# Patient Record
Sex: Female | Born: 1945 | Race: White | Hispanic: No | Smoking: Never smoker
Health system: Southern US, Community
[De-identification: ages and names within clinical notes are randomized; demographics above are authoritative.]

## PROBLEM LIST (undated history)

## (undated) DIAGNOSIS — E538 Deficiency of other specified B group vitamins: Secondary | ICD-10-CM

## (undated) DIAGNOSIS — G919 Hydrocephalus, unspecified: Secondary | ICD-10-CM

## (undated) DIAGNOSIS — C4490 Unspecified malignant neoplasm of skin, unspecified: Secondary | ICD-10-CM

## (undated) DIAGNOSIS — H9313 Tinnitus, bilateral: Secondary | ICD-10-CM

## (undated) DIAGNOSIS — K219 Gastro-esophageal reflux disease without esophagitis: Secondary | ICD-10-CM

## (undated) DIAGNOSIS — L738 Other specified follicular disorders: Secondary | ICD-10-CM

## (undated) DIAGNOSIS — N393 Stress incontinence (female) (male): Secondary | ICD-10-CM

## (undated) DIAGNOSIS — M1990 Unspecified osteoarthritis, unspecified site: Secondary | ICD-10-CM

## (undated) DIAGNOSIS — M810 Age-related osteoporosis without current pathological fracture: Secondary | ICD-10-CM

## (undated) DIAGNOSIS — M6530 Trigger finger, unspecified finger: Secondary | ICD-10-CM

## (undated) DIAGNOSIS — E785 Hyperlipidemia, unspecified: Secondary | ICD-10-CM

## (undated) DIAGNOSIS — L570 Actinic keratosis: Secondary | ICD-10-CM

## (undated) DIAGNOSIS — N3281 Overactive bladder: Secondary | ICD-10-CM

## (undated) DIAGNOSIS — N200 Calculus of kidney: Secondary | ICD-10-CM

## (undated) DIAGNOSIS — D126 Benign neoplasm of colon, unspecified: Secondary | ICD-10-CM

## (undated) DIAGNOSIS — Z86010 Personal history of colonic polyps: Secondary | ICD-10-CM

## (undated) DIAGNOSIS — N319 Neuromuscular dysfunction of bladder, unspecified: Secondary | ICD-10-CM

## (undated) DIAGNOSIS — E559 Vitamin D deficiency, unspecified: Secondary | ICD-10-CM

## (undated) DIAGNOSIS — N952 Postmenopausal atrophic vaginitis: Secondary | ICD-10-CM

## (undated) DIAGNOSIS — Z860101 Personal history of adenomatous and serrated colon polyps: Secondary | ICD-10-CM

## (undated) DIAGNOSIS — G5600 Carpal tunnel syndrome, unspecified upper limb: Secondary | ICD-10-CM

## (undated) DIAGNOSIS — K644 Residual hemorrhoidal skin tags: Secondary | ICD-10-CM

## (undated) HISTORY — DX: Tinnitus, bilateral: H93.13

## (undated) HISTORY — DX: Hydrocephalus, unspecified: G91.9

## (undated) HISTORY — DX: Hyperlipidemia, unspecified: E78.5

## (undated) HISTORY — DX: Overactive bladder: N32.81

## (undated) HISTORY — DX: Other specified follicular disorders: L73.8

## (undated) HISTORY — DX: Calculus of kidney: N20.0

## (undated) HISTORY — PX: TONSILLECTOMY: SUR1361

## (undated) HISTORY — DX: Actinic keratosis: L57.0

## (undated) HISTORY — DX: Benign neoplasm of colon, unspecified: D12.6

## (undated) HISTORY — DX: Unspecified osteoarthritis, unspecified site: M19.90

## (undated) HISTORY — DX: Trigger finger, unspecified finger: M65.30

## (undated) HISTORY — DX: Stress incontinence (female) (male): N39.3

## (undated) HISTORY — DX: Vitamin D deficiency, unspecified: E55.9

## (undated) HISTORY — PX: EYE SURGERY: SHX253

## (undated) HISTORY — DX: Personal history of colonic polyps: Z86.010

## (undated) HISTORY — DX: Personal history of adenomatous and serrated colon polyps: Z86.0101

## (undated) HISTORY — DX: Neuromuscular dysfunction of bladder, unspecified: N31.9

## (undated) HISTORY — DX: Unspecified malignant neoplasm of skin, unspecified: C44.90

## (undated) HISTORY — DX: Carpal tunnel syndrome, unspecified upper limb: G56.00

## (undated) HISTORY — DX: Postmenopausal atrophic vaginitis: N95.2

## (undated) HISTORY — DX: Age-related osteoporosis without current pathological fracture: M81.0

## (undated) HISTORY — DX: Deficiency of other specified B group vitamins: E53.8

## (undated) HISTORY — DX: Residual hemorrhoidal skin tags: K64.4

## (undated) HISTORY — PX: ABDOMINAL HYSTERECTOMY: SHX81

---

## 1998-10-10 DIAGNOSIS — D126 Benign neoplasm of colon, unspecified: Secondary | ICD-10-CM

## 1998-10-10 HISTORY — DX: Benign neoplasm of colon, unspecified: D12.6

## 2004-11-30 ENCOUNTER — Ambulatory Visit: Payer: Self-pay | Admitting: Internal Medicine

## 2004-12-03 ENCOUNTER — Ambulatory Visit (HOSPITAL_COMMUNITY): Admission: RE | Admit: 2004-12-03 | Discharge: 2004-12-03 | Payer: Self-pay | Admitting: Internal Medicine

## 2004-12-09 HISTORY — PX: ESOPHAGOGASTRODUODENOSCOPY: SHX1529

## 2004-12-09 HISTORY — PX: COLONOSCOPY: SHX174

## 2004-12-10 ENCOUNTER — Ambulatory Visit: Payer: Self-pay | Admitting: Internal Medicine

## 2004-12-10 ENCOUNTER — Ambulatory Visit (HOSPITAL_COMMUNITY): Admission: RE | Admit: 2004-12-10 | Discharge: 2004-12-10 | Payer: Self-pay | Admitting: Internal Medicine

## 2005-03-11 ENCOUNTER — Ambulatory Visit: Payer: Self-pay | Admitting: Internal Medicine

## 2006-05-04 IMAGING — RF DG ESOPHAGUS
14 of 24 series · 14 of 24 positions shown · non-contrast
Comparison: none

CLINICAL DATA: Dysphagia.  Patient states it feels like food is getting stuck in the throat/upper esophagus.  Thin liquids are not a problem.  Reportedly the esophagus was stretched 5-7 years ago.
BARIUM ESOPHAGRAM:
Single and double contrast studies were performed with the patient in the erect and recumbent positions.  The study was performed under fluoroscopic control.
The swallowing function appeared normal.  Symmetrical valleculae and pyriform sinuses.  Primary peristaltic stripping wave intact.  No tertiary contractions.  
Slight irregularity and slight mucosal fold thickening of the distal esophagus.I feel that the findings are compatible with reflux esophagitis.Mild stricture just proximal to a small hiatal hernia.  The patient swallowed a 13 mm in diameter barium tablet in the erect position and the tablet did not readily enter the stomach until after a few minutes of swallowing additional barium and water.

[Series 1: run · 1 of 1 slices shown (1 of 14)]
[im 1/1]
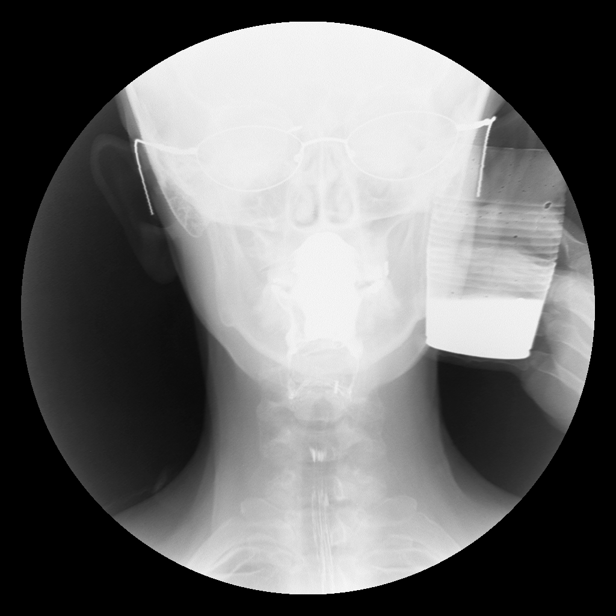

[Series 3: run · 1 of 1 slices shown (2 of 14)]
[im 1/1]
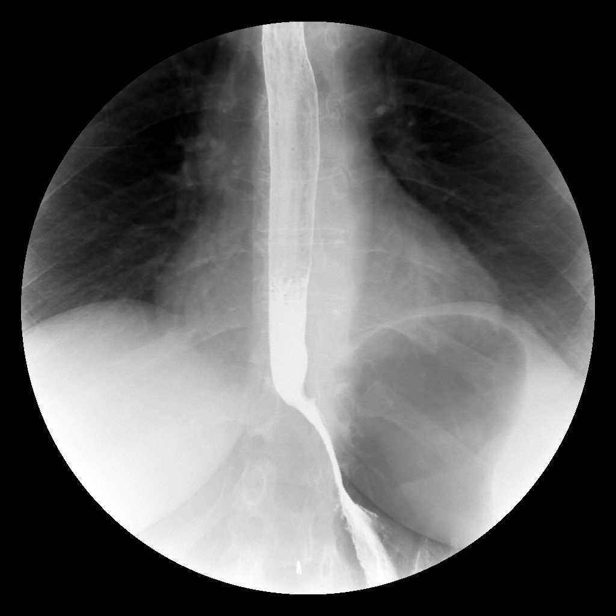

[Series 5: run · 1 of 1 slices shown (3 of 14)]
[im 1/1]
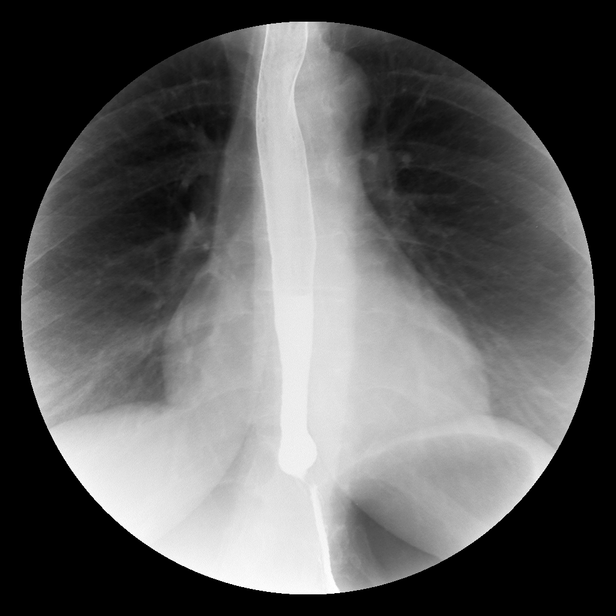

[Series 7: run · 1 of 1 slices shown (4 of 14)]
[im 1/1]
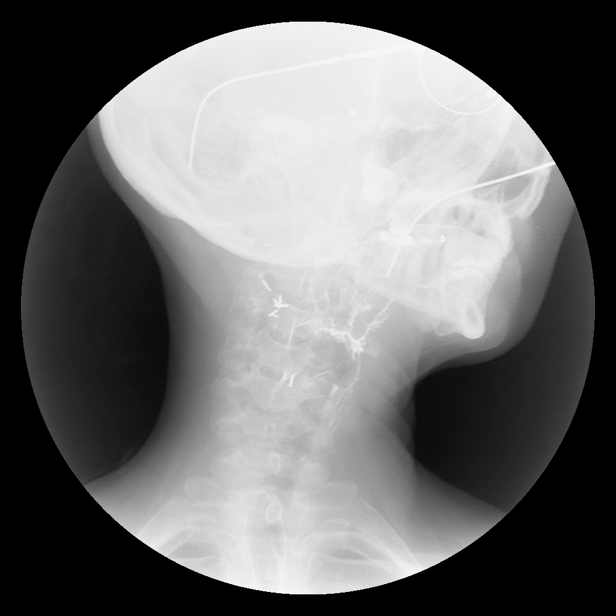

[Series 8: run · 1 of 1 slices shown (5 of 14)]
[im 1/1]
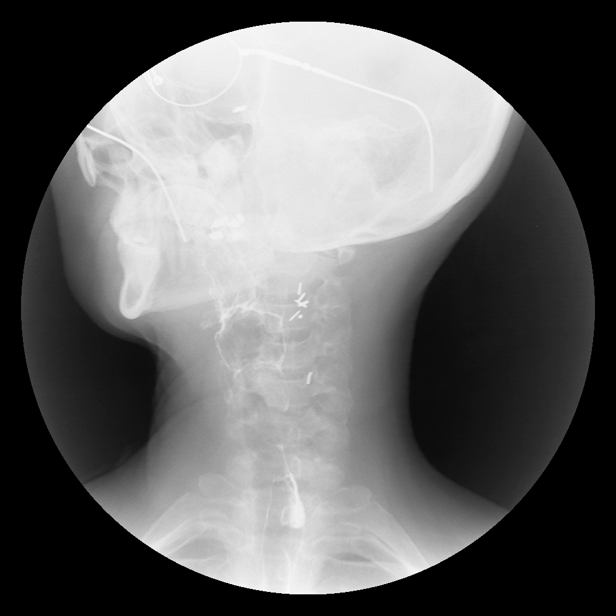

[Series 10: run · 1 of 1 slices shown (6 of 14)]
[im 1/1]
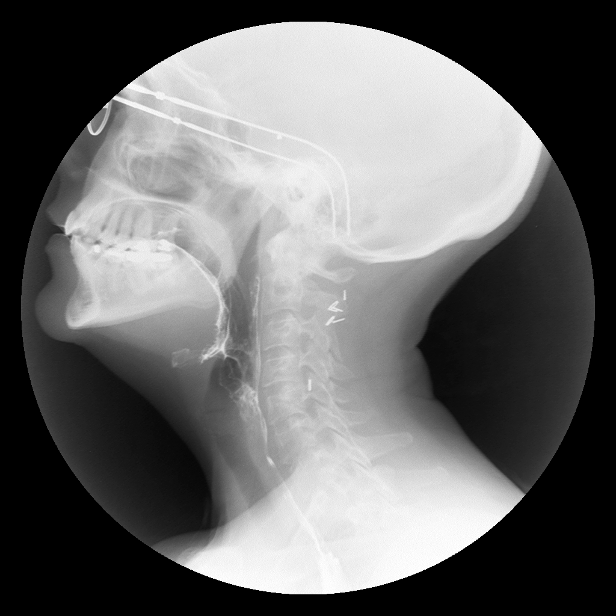

[Series 12: run · 1 of 1 slices shown (7 of 14)]
[im 1/1]
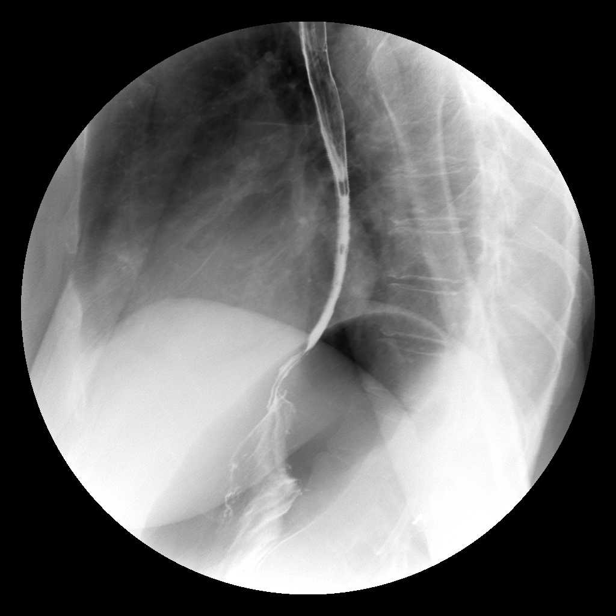

[Series 13: run · 1 of 1 slices shown (8 of 14)]
[im 1/1]
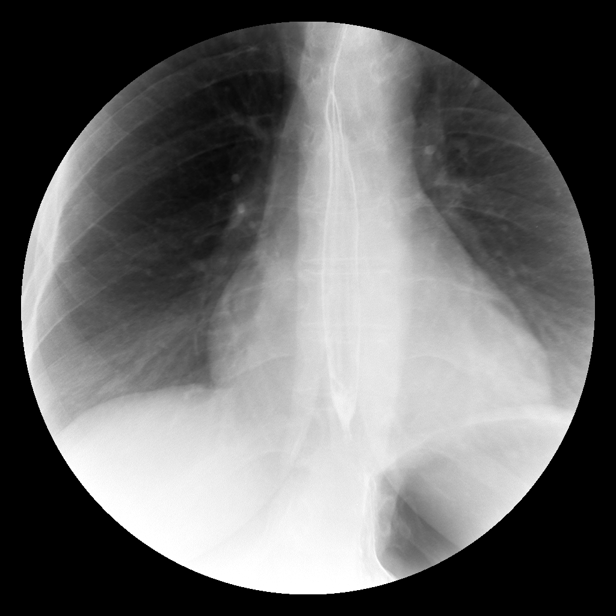

[Series 15: run · 1 of 1 slices shown (9 of 14)]
[im 1/1]
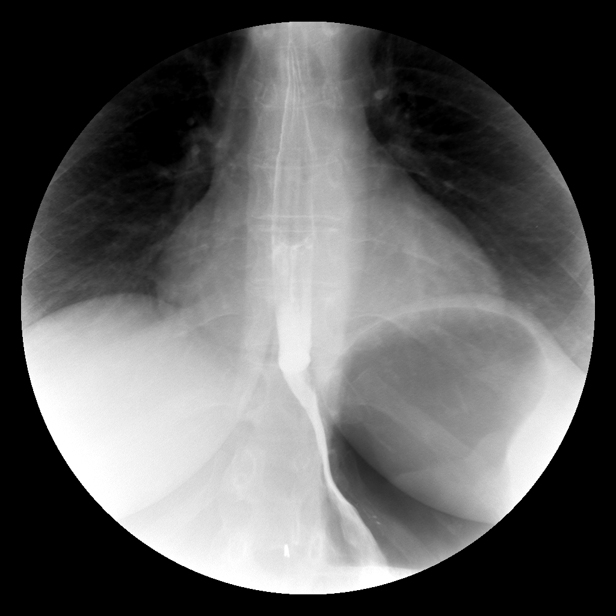

[Series 17: run · 1 of 1 slices shown (10 of 14)]
[im 1/1]
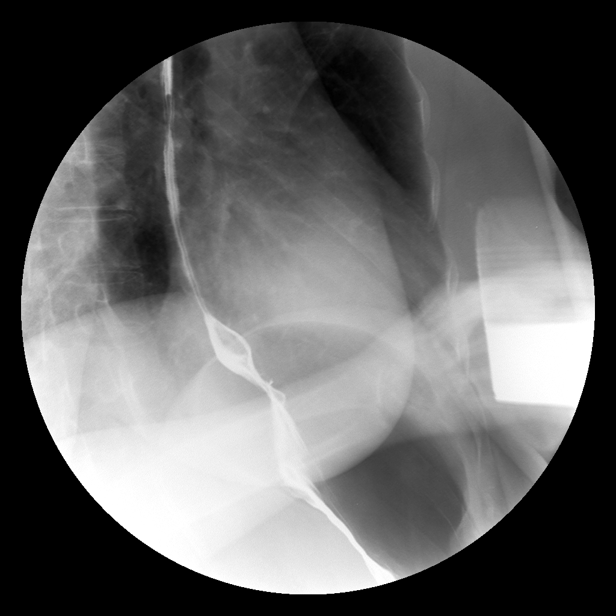

[Series 19: run · 1 of 1 slices shown (11 of 14)]
[im 1/1]
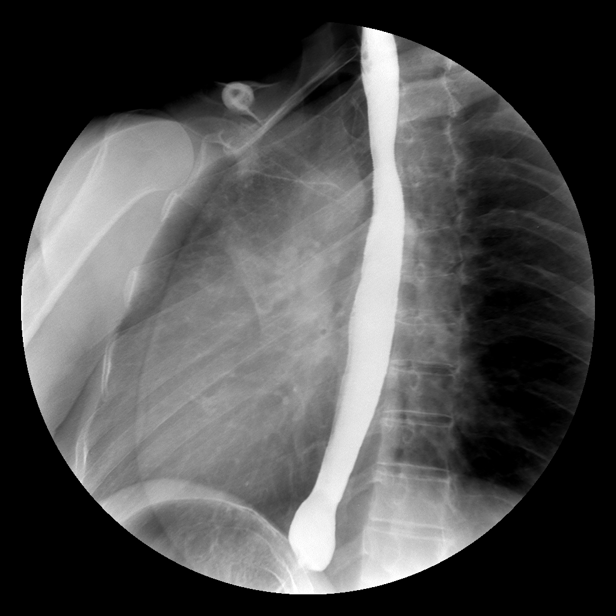

[Series 20: run · 1 of 1 slices shown (12 of 14)]
[im 1/1]
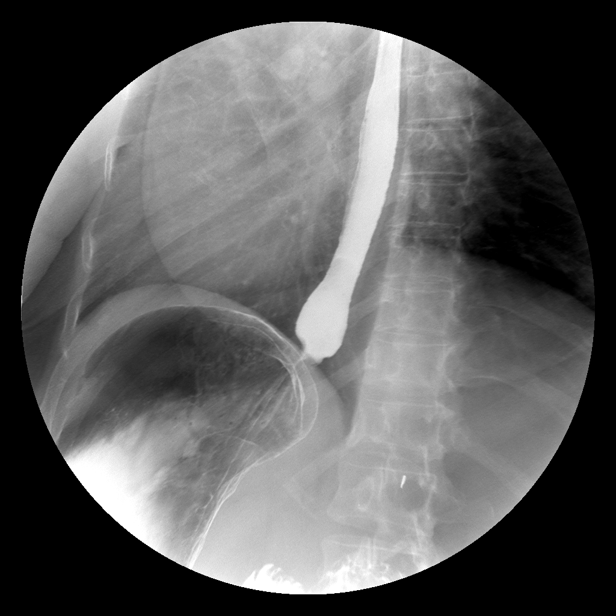

[Series 22: run · 1 of 1 slices shown (13 of 14)]
[im 1/1]
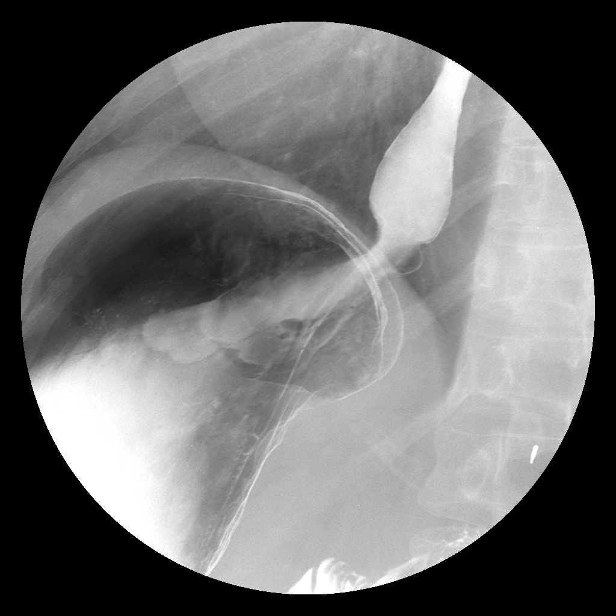

[Series 24: run · 1 of 1 slices shown (14 of 14)]
[im 1/1]
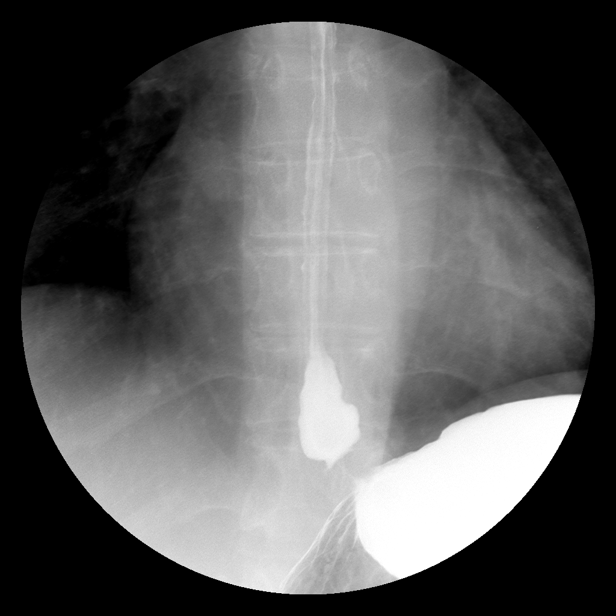

[14 of 24 positions shown; findings below may reference images not displayed]

IMPRESSION: Findings compatible with changes due to chronic reflux esophagitis, i.e., mild stricture at the gastroesophageal junction.
Small hiatal hernia.  Gastroesophageal reflux.

## 2009-10-10 HISTORY — PX: COLONOSCOPY: SHX174

## 2010-01-18 ENCOUNTER — Encounter (INDEPENDENT_AMBULATORY_CARE_PROVIDER_SITE_OTHER): Payer: Self-pay | Admitting: *Deleted

## 2010-07-27 ENCOUNTER — Telehealth (INDEPENDENT_AMBULATORY_CARE_PROVIDER_SITE_OTHER): Payer: Self-pay

## 2010-08-06 ENCOUNTER — Encounter: Payer: Self-pay | Admitting: Internal Medicine

## 2010-08-20 ENCOUNTER — Ambulatory Visit: Payer: Self-pay | Admitting: Internal Medicine

## 2010-08-20 ENCOUNTER — Ambulatory Visit (HOSPITAL_COMMUNITY): Admission: RE | Admit: 2010-08-20 | Discharge: 2010-08-20 | Payer: Self-pay | Admitting: Internal Medicine

## 2010-08-25 ENCOUNTER — Encounter: Payer: Self-pay | Admitting: Internal Medicine

## 2010-11-09 NOTE — Progress Notes (Signed)
Summary: Changed time only for colonoscopy  Phone Note Call from Patient   Caller: Patient Summary of Call: Pt was scheduled for colonoscopy on 08/20/2010 @ 7:30 and she called and rescheduled to same day at 10:30 AM.  (Order to be scanned after Dr. Jena Gauss signs off ). Changed in IDX and faxed change to [facility]. Initial call taken by: Cloria Spring LPN,  July 27, 2010 4:23 PM

## 2010-11-09 NOTE — Letter (Signed)
Summary: Scheduled Appointment  [facility]  [address]   [place], [place] [address]   Phone: [phone]  Fax: [phone]    January 18, 2010   Dear: Amy Morales            DOB: 27-Sep-1946    I have been instructed to schedule you an appointment in our office.  Your appointment is as follows:   Date: 02/12/10   Time: 1100am     Please be here 15 minutes early.   Provider: Clement Husbands    Please contact the office if you need to reschedule this appointment for a more convenient time.   Thank you,    [facility] Ph: [phone]   Fax: [phone]

## 2010-11-09 NOTE — Letter (Signed)
Summary: [facility] ORDER/TRIAGE  [facility] ORDER/TRIAGE   Imported By: Rexene Alberts 08/06/2010 11:27:53  _____________________________________________________________________  External Attachment:    Type:   Image     Comment:   External Document

## 2010-11-09 NOTE — Letter (Signed)
Summary: Patient Notice, Colon Biopsy Results  [facility]  [address]   [place], [place] [address]   Phone: [phone]  Fax: [phone]       August 25, 2010   Amy Morales [address] [place], [place]  [address] 04/09/1946    Dear [name],  I am pleased to inform you that the biopsies taken during your recent colonoscopy did not show any evidence of cancer upon pathologic examination.  Additional information/recommendations:  No further action is needed at this time.  Please follow-up with your primary care physician for your other healthcare needs.  You should have a repeat colonoscopy examination  in 5 years.  Please call us if you are having persistent problems or have questions about your condition that have not been fully answered at this time.  Sincerely,    R. Roetta Sessions MD, FACP [facility] Ph: [phone]    Fax: [phone]   Appended Document: Patient Notice, Colon Biopsy Results Letter mailed to pt. Copies for PCP, also.  Appended Document: Patient Notice, Colon Biopsy Results reminder in computer

## 2011-02-25 NOTE — H&P (Signed)
Amy Morales, Amy Morales              ACCOUNT NO.:  [id number]   MEDICAL RECORD NO.:  [id number]           PATIENT TYPE:   LOCATION:                                 FACILITY:   PHYSICIAN:  R. Roetta Sessions, M.D. DATE OF BIRTH:  02-07-1946   DATE OF ADMISSION:  11/30/2004  DATE OF DISCHARGE:                                HISTORY & PHYSICAL   CHIEF COMPLAINT:  Rectal bleeding.   HISTORY OF PRESENT ILLNESS:  The patient is a 65 year old lady who presents  with a two-week-history of hematochezia.  She is passing bright red blood  per rectum with bowel movements.  She has had some constipation.  Her bowels  are intermittent in nature and she may go for several days without a bowel  movement, but then may have a bowel movement every day for several days.  Sometime her stools are hard.  Denies any melena, abdominal pain, weight  loss, nausea, vomiting, or heartburn.  Sometimes it feels like her food does  not go down well.  She has had symptoms which are similar over the last five  years and are not worsening.  She has had her esophagus empirically  stretched five years ago at the time of a normal EGD.  She also complains of  rhinorrhea and post-nasal drip along with a cough and congestion for about  three weeks.  Denies any fevers or chills.   CURRENT MEDICATIONS:  1.  Premarin 0.625 mg daily.  2.  Multivitamin over-the-counter daily.  3.  Vitamin C daily.  4.  Vitamin E 400 IU daily.  5.  Vitamin B12, 500 mcg daily.  6.  Calcium 600 mg plus D daily.  7.  Chromium 200 mg daily.  8.  Vitamin A 10,000 IU daily.  9.  Aspirin 81 mg daily.  10. Tums p.r.n.  11. Zocor daily.  12. Fish oil daily.  13. B-complex daily.   ALLERGIES:  CODEINE.   PAST MEDICAL/SURGICAL HISTORY:  1.  Hypercholesterolemia.  2.  History of possible hydrocephalus over 35 years ago.  3.  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  4.  She has chronic constipation.  5.  Hemorrhoids.  6.  History of tubular villus and tubular adenoma at the time of colonoscopy      in 2000.  Her last colonoscopy in April 2003, revealed prominent      engorged internal hemorrhoids.  A single anal papilla.  7.  She has had a hysterectomy.   FAMILY HISTORY:  Father deceased.  Had black lung disease.  He had a  myocardial infarction as well.  She has a brother with alcoholic cirrhosis.  She has a son with a history of colonic polyps.  No family history of  colorectal cancer.   SOCIAL HISTORY:  She is divorced.  She has three sons.  She is employed and  works at [employer].  She does not smoke or drink alcohol.   REVIEW OF SYSTEMS:  See the HPI for GI and  HEENT.  CARDIOPULMONARY:  Denies  chest pain or shortness of breath.  See the HPI regarding cough.   PHYSICAL EXAMINATION:  VITAL SIGNS:  Weight is 147-1/2 pounds, temperature  97.9 degrees, blood pressure 110/70, pulse 68.  GENERAL:  Pleasant well-developed and well-nourished Caucasian female, in no  acute distress.  SKIN:  Warm and dry.  No jaundice.  HEENT:  Conjunctivae are pink. Sclerae anicteric.  Oropharyngeal mucosa is  moist and pink.  No erythema.  She does have some mucus in the back of her  throat.  Maxillary sinuses look mildly tender to percussion.  NECK:  No lymphadenopathy.  CHEST:  Clear to auscultation.  HEART:  A regular rate and rhythm.  Normal S1, S2.  No murmurs, rubs, or  gallops.  ABDOMEN:  Positive bowel sounds, soft, nontender, nondistended.  No  organomegaly or masses.  No rebound tenderness or guarding.  EXTREMITIES:  No edema.   IMPRESSION:  Amy Morales is a 65 year old lady with a history of colonic  adenomatous polyps, who presents with a two-week history of hematochezia.  This may very well be due to hemorrhoids; however, given her last  colonoscopy was over three years ago, I would recommend one at this time for  further evaluation and diagnostic purposes.  In addition, she  continues to  have chronic mild dysphagia.  Previously empiric esophageal dilatation did  not seem to help.  Currently she appears to have acute/chronic sinusitis.   PLAN:  1.  Barium esophagram.  We will arrange for this to be done prior to her      colonoscopy, in case we need to proceed with an EGD as well.  2.  Colonoscopy in the near future.  3.  Will provide her with SBE prophylaxis, as we have done in the past,      given the history of a shunt.  4.  Levaquin 500 mg p.o. daily for 10 days, #10 with 0 refills.      LL/MEDQ  D:  11/30/2004  T:  11/30/2004  Job:  [id number]

## 2011-02-25 NOTE — Op Note (Signed)
NAME:  Amy Morales, Amy Morales              ACCOUNT NO.:  [id number]   MEDICAL RECORD NO.:  [id number]          PATIENT TYPE:  AMB   LOCATION:  DAY                           FACILITY:  [facility]   PHYSICIAN:  R. Roetta Sessions, M.D. DATE OF BIRTH:  12-10-1945   DATE OF PROCEDURE:  12/09/2004  DATE OF DISCHARGE:                                 OPERATIVE REPORT   PROCEDURES:  Esophagogastroduodenoscopy with Elease Hashimoto dilation, followed by  colonoscopy, diagnostic.   INDICATION FOR PROCEDURE:  The patient is a 65 year old lady with a history  of colonic polyps who presents with a two-week history of intermittent  hematochezia.  Also, she has esophageal dysphagia to solids.  Barium pill  esophagogram demonstrated delay in passage of barium pills and granularity  of the esophageal mucosa suspicious for reflux esophagitis.  EGD and  colonoscopy are now being done.  This approach has been discussed with the  patient at length, the potential risks, benefits, and alternatives have been  reviewed, questions answered, and she is agreeable.  Please see  documentation in the medical record.   PROCEDURE:  O2 saturation, blood pressure, pulse, and respiration were  monitored throughout the entirety of the procedures.   CONSCIOUS SEDATION:  Versed 10 mg IV, Demerol 150 mg IV in divided doses.   INSTRUMENT USED:  Olympus video chip system.   FINDINGS:  Esophagogastroduodenoscopy:  Examination of the tubular esophagus  revealed what appeared to be a noncritical ring with associated distal  esophageal erosions with no Barrett's esophagus, fixed stricture or any  evidence of neoplasia.  The EG junction was easily traversed with the scope.   The gastric cavity was empty and insufflated well with air.  A thorough  examination of the gastric mucosa, including a retroflexed view of the  proximal stomach and esophagogastric junction, demonstrated only a very  small hiatal hernia.  Pylorus patent and easily traversed.   Examination of  the bulb and second portion revealed no abnormalities.   Therapeutic/diagnostic maneuvers performed:  I initially attempted to pass a  33 French dilator, but I could not get it started beyond the hypopharynx.  Subsequently I obtained a 11 [place] and passed it to full insertion  with ease.  A look back revealed the ring had been ruptured without apparent  complication.  The patient tolerated the procedure well and was prepared for  colonoscopy.   Digital rectal examination revealed no abnormalities.  Endoscopic findings:  The prep was good.   Rectum:  Examination of the rectal mucosa including a retroflexed view of  the anal verge revealed en face view of the anal canal that demonstrated  only some friable internal hemorrhoids.   Colon:  The colonic mucosa was surveyed from the rectosigmoid junction  through the left, transverse and right colon to the area of the appendiceal  orifice, ileocecal valve and cecum.  These structures were well-seen and  photographed for the record.  From this level the scope was slowly  withdrawn, all previous-mentioned mucosal surfaces were again seen.  The  patient had a solitary 3 mm AVM in the mid-descending colon  felt to be an  innocent bystander.  The colonic mucosa otherwise appeared normal.  The  patient tolerated the procedure well and was reacted in endoscopy.   IMPRESSION:  Colonoscopy:  1.  Friable anal canal hemorrhoids, otherwise normal rectum.  2.  Quiescent 3 mm arteriovenous malformation, mid-descending colon, not      felt to be causing any symptoms, not manipulated.  The remainder of the      colon appeared normal.   Esophagogastroduodenoscopy findings:  1.  Noncritical-appearing Schatzki's ring with associated distal esophageal      erosions consistent with mild reflux esophagitis, status post Maloney      dilation as described above, otherwise normal esophagus.  2.  Small hiatal hernia, otherwise normal  stomach, D1, D2.   RECOMMENDATIONS:  1.  Aciphex 20 mg orally daily before breakfast for three months.  2.  Hemorrhoid literature provided to Ms. Fahs.  3.  A 10-day course of Anusol-HC suppositories one per rectum at bedtime.  4.  A follow-up appointment with [place] in three months.  5.  She should plan to have another colonoscopy in five years.      RMR/MEDQ  D:  12/10/2004  T:  12/10/2004  Job:  [id number]

## 2015-07-23 ENCOUNTER — Encounter: Payer: Self-pay | Admitting: Internal Medicine

## 2015-09-09 ENCOUNTER — Ambulatory Visit (INDEPENDENT_AMBULATORY_CARE_PROVIDER_SITE_OTHER): Payer: Medicare Other | Admitting: Gastroenterology

## 2015-09-09 ENCOUNTER — Other Ambulatory Visit: Payer: Self-pay

## 2015-09-09 ENCOUNTER — Encounter: Payer: Self-pay | Admitting: Gastroenterology

## 2015-09-09 VITALS — BP 118/78 | HR 91 | Temp 97.6°F | Ht 63.0 in | Wt 144.8 lb

## 2015-09-09 DIAGNOSIS — K5900 Constipation, unspecified: Secondary | ICD-10-CM | POA: Insufficient documentation

## 2015-09-09 DIAGNOSIS — Z86010 Personal history of colonic polyps: Secondary | ICD-10-CM

## 2015-09-09 MED ORDER — NA SULFATE-K SULFATE-MG SULF 17.5-3.13-1.6 GM/177ML PO SOLN: 1.0000 | Freq: Once | ORAL | Status: AC

## 2015-09-09 NOTE — Patient Instructions (Signed)
1. Colonoscopy as planned. See separate instructions.  2. Start Linzess once daily on empty stomach FIVE days before your procedure. Samples provided.

## 2015-09-09 NOTE — Progress Notes (Signed)
Primary Care Physician:  [name], MD    Primary Gastroenterologist:  Garfield Cornea, MD   Chief Complaint  Patient presents with  . Colonoscopy    HPI:  Amy Morales is a 69 y.o. female here to schedule surveillance colonoscopy. 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 Last colonoscopy 2011 with adenomatous colon polyp. She has chronic constipation. Recently added probiotics which may be helping a little. 3 bowel movements per week. No blood in the stool or melena. No abdominal pain. Denies heartburn, dysphagia, weight loss. Currently being treated for pneumonia, bronchitis, sinusitis.    Current Outpatient Prescriptions  Medication Sig Dispense Refill  . B Complex-C-Folic Acid (KP B COMPLEX-C) TABS Take by mouth daily.     . cefdinir (OMNICEF) 300 MG capsule Take by mouth.    Marland Kitchen imipramine (TOFRANIL) 50 MG tablet Take 50 mg by mouth at bedtime.     . predniSONE (DELTASONE) 20 MG tablet Take 20 mg by mouth daily with breakfast.     . simvastatin (ZOCOR) 40 MG tablet Take 40 mg by mouth daily at 6 PM.      No current facility-administered medications for this visit.    Allergies as of 09/09/2015 - Review Complete 09/09/2015  Allergen Reaction Noted  . Latex Rash 09/09/2015  . Codeine Nausea And Vomiting 09/09/2015    Past Medical History  Diagnosis Date  . Hyperlipemia     Past Surgical History  Procedure Laterality Date  . Esophagogastroduodenoscopy  12/09/04    RMR:non-critical appearing schatzkis ring/small HH otherwise normal  . Colonoscopy  12/09/04    [id number] anal canal hemorrhoids otherwise normal rectum. quiescent 64mm AVM, mid-descending colon, not manipulated.   . Colonoscopy  2011    RMR: adenomatous colon polyp    Family History  Problem Relation Age of Onset  . Colon cancer Neg Hx   . Colon polyps Son   . Lung disease Father     black lung  . Heart attack Father     Social History   Social History  . Marital  Status: Divorced    Spouse Name: [name]  . Number of Children: [name]  . Years of Education: [name]   Occupational History  . Not on file.   Social History Main Topics  . Smoking status: Never Smoker   . Smokeless tobacco: Not on file  . Alcohol Use: No  . Drug Use: No  . Sexual Activity: Not on file   Other Topics Concern  . Not on file   Social History Narrative      ROS:  General: Negative for anorexia, weight loss, fever, chills, fatigue, weakness. Eyes: Negative for vision changes.  ENT: Negative for hoarseness, difficulty swallowing , nasal congestion. CV: Negative for chest pain, angina, palpitations, dyspnea on exertion, peripheral edema.  Respiratory: Negative for dyspnea at rest, dyspnea on exertion, cough, sputum, wheezing.  GI: See history of present illness. GU:  Negative for dysuria, hematuria, urinary incontinence, urinary frequency, nocturnal urination.  MS: Negative for joint pain, low back pain.  Derm: Negative for rash or itching.  Neuro: Negative for weakness, abnormal sensation, seizure, frequent headaches, memory loss, confusion.  Psych: Negative for anxiety, depression, suicidal ideation, hallucinations.  Endo: Negative for unusual weight change.  Heme: Negative for bruising or bleeding. Allergy: Negative for rash or hives.    Physical Examination:  BP 118/78 mmHg  Pulse 91  Temp(Src) 97.6 F (36.4 C) (Oral)  Ht 5\' 3"  (1.6 m)  Wt 144 lb 12.8 oz (65.681 kg)  BMI 25.66 kg/m2   General: Well-nourished, well-developed in no acute distress.  Head: Normocephalic, atraumatic.   Eyes: Conjunctiva pink, no icterus. Mouth: Oropharyngeal mucosa moist and pink , no lesions erythema or exudate. Neck: Supple without thyromegaly, masses, or lymphadenopathy.  Lungs: Clear to auscultation bilaterally.  Heart: Regular rate and rhythm, no murmurs rubs or gallops.  Abdomen: Bowel sounds are normal, nontender, nondistended, no hepatosplenomegaly or masses, no  abdominal bruits or    hernia , no rebound or guarding.   Rectal: deferred Extremities: No lower extremity edema. No clubbing or deformities.  Neuro: Alert and oriented x 4 , grossly normal neurologically.  Skin: Warm and dry, no rash or jaundice.   Psych: Alert and cooperative, normal mood and affect.   Imaging Studies: No results found.

## 2015-09-14 ENCOUNTER — Encounter: Payer: Self-pay | Admitting: Gastroenterology

## 2015-09-14 NOTE — Progress Notes (Signed)
cc'ed to pcp °

## 2015-09-14 NOTE — Assessment & Plan Note (Signed)
69 year old female with history of adenomatous colon polyps here to schedule surveillance colonoscopy. She has chronic constipation. Probiotics helping. Prior to colonoscopy, will start Linzess once daily for five days before procedure. Samples provided.  I have discussed the risks, alternatives, benefits with regards to but not limited to the risk of reaction to medication, bleeding, infection, perforation and the patient is agreeable to proceed. Written consent to be obtained.

## 2015-09-15 ENCOUNTER — Other Ambulatory Visit: Payer: Self-pay

## 2015-09-15 DIAGNOSIS — Z86010 Personal history of colonic polyps: Secondary | ICD-10-CM

## 2015-09-15 DIAGNOSIS — K5900 Constipation, unspecified: Secondary | ICD-10-CM

## 2015-09-21 ENCOUNTER — Telehealth: Payer: Self-pay | Admitting: Internal Medicine

## 2015-09-21 ENCOUNTER — Other Ambulatory Visit: Payer: Self-pay

## 2015-09-21 NOTE — Telephone Encounter (Signed)
Patient called to say that her pharmacy said that they have not received her prep prescription. I told her that it was confirmed on 11/30 that [facility] in [place], [place] received it. Patient said she just got off the phone with them and they said that they didn't have it. Please resend.

## 2015-09-21 NOTE — Telephone Encounter (Signed)
Called pharmacy and gave verbal order for RX for prep

## 2015-10-13 ENCOUNTER — Ambulatory Visit (HOSPITAL_COMMUNITY)
Admission: RE | Admit: 2015-10-13 | Discharge: 2015-10-13 | Disposition: A | Discharge status: Home / Self Care | Payer: Medicare Other | Source: Ambulatory Visit | Attending: Internal Medicine | Admitting: Internal Medicine

## 2015-10-13 ENCOUNTER — Encounter (HOSPITAL_COMMUNITY): Payer: Self-pay | Admitting: *Deleted

## 2015-10-13 ENCOUNTER — Encounter (HOSPITAL_COMMUNITY)
Admission: RE | Disposition: A | Discharge status: Home / Self Care | Payer: Self-pay | Source: Ambulatory Visit | Attending: Internal Medicine

## 2015-10-13 DIAGNOSIS — K5900 Constipation, unspecified: Secondary | ICD-10-CM

## 2015-10-13 DIAGNOSIS — D125 Benign neoplasm of sigmoid colon: Secondary | ICD-10-CM

## 2015-10-13 DIAGNOSIS — E785 Hyperlipidemia, unspecified: Secondary | ICD-10-CM | POA: Diagnosis not present

## 2015-10-13 DIAGNOSIS — K219 Gastro-esophageal reflux disease without esophagitis: Secondary | ICD-10-CM | POA: Insufficient documentation

## 2015-10-13 DIAGNOSIS — Z1211 Encounter for screening for malignant neoplasm of colon: Secondary | ICD-10-CM | POA: Insufficient documentation

## 2015-10-13 DIAGNOSIS — Z79899 Other long term (current) drug therapy: Secondary | ICD-10-CM | POA: Insufficient documentation

## 2015-10-13 DIAGNOSIS — Q2733 Arteriovenous malformation of digestive system vessel: Secondary | ICD-10-CM | POA: Diagnosis not present

## 2015-10-13 DIAGNOSIS — Z86010 Personal history of colonic polyps: Secondary | ICD-10-CM

## 2015-10-13 HISTORY — PX: COLONOSCOPY: SHX5424

## 2015-10-13 HISTORY — DX: Gastro-esophageal reflux disease without esophagitis: K21.9

## 2015-10-13 SURGERY — COLONOSCOPY
Anesthesia: Moderate Sedation

## 2015-10-13 MED ORDER — ONDANSETRON HCL 4 MG/2ML IJ SOLN
INTRAMUSCULAR | Status: AC
  Filled 2015-10-13: qty 2

## 2015-10-13 MED ORDER — MEPERIDINE HCL 100 MG/ML IJ SOLN
INTRAMUSCULAR | Status: DC | PRN
  Administered 2015-10-13 (×2): 25 mg via INTRAVENOUS
  Administered 2015-10-13: 50 mg via INTRAVENOUS

## 2015-10-13 MED ORDER — ONDANSETRON HCL 4 MG/2ML IJ SOLN
INTRAMUSCULAR | Status: DC | PRN
  Administered 2015-10-13: 4 mg via INTRAVENOUS

## 2015-10-13 MED ORDER — SODIUM CHLORIDE 0.9 % IV SOLN
INTRAVENOUS | Status: DC
  Administered 2015-10-13: 1000 mL via INTRAVENOUS

## 2015-10-13 MED ORDER — MIDAZOLAM HCL 5 MG/5ML IJ SOLN
INTRAMUSCULAR | Status: DC | PRN
  Administered 2015-10-13 (×2): 1 mg via INTRAVENOUS
  Administered 2015-10-13 (×2): 2 mg via INTRAVENOUS

## 2015-10-13 MED ORDER — MIDAZOLAM HCL 5 MG/5ML IJ SOLN
INTRAMUSCULAR | Status: DC
Start: 2015-10-13 — End: 2015-10-13
  Filled 2015-10-13: qty 10

## 2015-10-13 MED ORDER — MEPERIDINE HCL 100 MG/ML IJ SOLN
INTRAMUSCULAR | Status: AC
  Filled 2015-10-13: qty 2

## 2015-10-13 NOTE — H&P (Signed)
@  [id number]   Primary Care Physician:  [name], MD Primary Gastroenterologist:  Dr. Gala Romney  Pre-Procedure History & Physical: HPI:  Amy Morales is a 70 y.o. female here for   Past Medical History  Diagnosis Date  . Hyperlipemia   . Hydrocephalus     went stent in 1969  . Tubulovillous adenoma polyp of colon 2000  . GERD (gastroesophageal reflux disease)     Past Surgical History  Procedure Laterality Date  . Esophagogastroduodenoscopy  12/09/04    RMR:non-critical appearing schatzkis ring/small HH otherwise normal  . Colonoscopy  12/09/04    [id number] anal canal hemorrhoids otherwise normal rectum. quiescent 76mm AVM, mid-descending colon, not manipulated.   . Colonoscopy  2011    RMR: adenomatous colon polyp  . Abdominal hysterectomy      Prior to Admission medications   Medication Sig Start Date End Date Taking? Authorizing Provider  B Complex-C-Folic Acid (KP B COMPLEX-C) TABS Take 1 tablet by mouth daily.    Yes Historical Provider, MD  imipramine (TOFRANIL) 50 MG tablet Take 50 mg by mouth at bedtime.    Yes Historical Provider, MD  simvastatin (ZOCOR) 40 MG tablet Take 40 mg by mouth daily at 6 PM.    Yes Historical Provider, MD    Allergies as of 09/15/2015 - Review Complete 09/09/2015  Allergen Reaction Noted  . Latex Rash 09/09/2015  . Codeine Nausea And Vomiting 09/09/2015    Family History  Problem Relation Age of Onset  . Colon cancer Neg Hx   . Colon polyps Son   . Lung disease Father     black lung  . Heart attack Father     Social History   Social History  . Marital Status: Divorced    Spouse Name: [name]  . Number of Children: [name]  . Years of Education: [name]   Occupational History  . Not on file.   Social History Main Topics  . Smoking status: Never Smoker   . Smokeless tobacco: Not on file  . Alcohol Use: No  . Drug Use: No  . Sexual Activity: Not on file   Other Topics Concern  . Not on file   Social History Narrative     Review of Systems: See HPI, otherwise negative ROS  Physical Exam: BP 120/71 mmHg  Pulse 85  Temp(Src) 97.8 F (36.6 C) (Oral)  Resp 15  Ht 5\' 3"  (1.6 m)  Wt 142 lb (64.411 kg)  BMI 25.16 kg/m2  SpO2 95% General:    pleasant and cooperative in NAD Skin:  Intact without significant lesions or rashes. Eyes:  Sclera clear, no icterus.   Conjunctiva pink. Lungs:  Clear throughout to auscultation.   No wheezes, crackles, or rhonchi. No acute distress. Heart:  Regular rate and rhythm; no murmurs, clicks, rubs,  or gallops. Abdomen: Non-distended, normal bowel sounds.  Soft and nontender without appreciable mass or hepatosplenomegaly.  Pulses:  Normal pulses noted. Extremities:  Without clubbing or edema.  Impression:  Pleasant 70 year old lady with a history of advanced adenoma previously. Here for surveillance examination.  The risks, benefits, limitations, alternatives and imponderables have been reviewed with the patient. Questions have been answered. All parties are agreeable.   Recommendations:   Surveillance colonoscopy later today. Further recommendations to follow.     Notice: This dictation was prepared with Dragon dictation along with smaller phrase technology. Any transcriptional errors that result from this process are unintentional and may not be corrected upon review.

## 2015-10-13 NOTE — Discharge Instructions (Signed)
°Colonoscopy °Discharge Instructions ° °Read the instructions outlined below and refer to this sheet in the next few weeks. These discharge instructions provide you with general information on caring for yourself after you leave the hospital. Your doctor may also give you specific instructions. While your treatment has been planned according to the most current medical practices available, unavoidable complications occasionally occur. If you have any problems or questions after discharge, call Dr. Rourk at [phone]. °ACTIVITY °· You may resume your regular activity, but move at a slower pace for the next 24 hours.  °· Take frequent rest periods for the next 24 hours.  °· Walking will help get rid of the air and reduce the bloated feeling in your belly (abdomen).  °· No driving for 24 hours (because of the medicine (anesthesia) used during the test).   °· Do not sign any important legal documents or operate any machinery for 24 hours (because of the anesthesia used during the test).  °NUTRITION °· Drink plenty of fluids.  °· You may resume your normal diet as instructed by your doctor.  °· Begin with a light meal and progress to your normal diet. Heavy or fried foods are harder to digest and may make you feel sick to your stomach (nauseated).  °· Avoid alcoholic beverages for 24 hours or as instructed.  °MEDICATIONS °· You may resume your normal medications unless your doctor tells you otherwise.  °WHAT YOU CAN EXPECT TODAY °· Some feelings of bloating in the abdomen.  °· Passage of more gas than usual.  °· Spotting of blood in your stool or on the toilet paper.  °IF YOU HAD POLYPS REMOVED DURING THE COLONOSCOPY: °· No aspirin products for 7 days or as instructed.  °· No alcohol for 7 days or as instructed.  °· Eat a soft diet for the next 24 hours.  °FINDING OUT THE RESULTS OF YOUR TEST °Not all test results are available during your visit. If your test results are not back during the visit, make an appointment  with your caregiver to find out the results. Do not assume everything is normal if you have not heard from your caregiver or the medical facility. It is important for you to follow up on all of your test results.  °SEEK IMMEDIATE MEDICAL ATTENTION IF: °· You have more than a spotting of blood in your stool.  °· Your belly is swollen (abdominal distention).  °· You are nauseated or vomiting.  °· You have a temperature over 101.  °· You have abdominal pain or discomfort that is severe or gets worse throughout the day.  ° ° °Colon polyp information provided °Further recommendations to follow pending review of pathology report ° ° °Colon Polyps °Polyps are lumps of extra tissue growing inside the body. Polyps can grow in the large intestine (colon). Most colon polyps are noncancerous (benign). However, some colon polyps can become cancerous over time. Polyps that are larger than a pea may be harmful. To be safe, caregivers remove and test all polyps. °CAUSES  °Polyps form when mutations in the genes cause your cells to grow and divide even though no more tissue is needed. °RISK FACTORS °There are a number of risk factors that can increase your chances of getting colon polyps. They include: °· Being older than 50 years. °· Family history of colon polyps or colon cancer. °· Long-term colon diseases, such as colitis or Crohn disease. °· Being overweight. °· Smoking. °· Being inactive. °· Drinking too much alcohol. °  SYMPTOMS  °Most small polyps do not cause symptoms. If symptoms are present, they may include: °· Blood in the stool. The stool may look dark red or black. °· Constipation or diarrhea that lasts longer than 1 week. °DIAGNOSIS °People often do not know they have polyps until their caregiver finds them during a regular checkup. Your caregiver can use 4 tests to check for polyps: °· Digital rectal exam. The caregiver wears gloves and feels inside the rectum. This test would find polyps only in the rectum. °· Barium  enema. The caregiver puts a liquid called barium into your rectum before taking X-rays of your colon. Barium makes your colon look white. Polyps are dark, so they are easy to see in the X-ray pictures. °· Sigmoidoscopy. A thin, flexible tube (sigmoidoscope) is placed into your rectum. The sigmoidoscope has a light and tiny camera in it. The caregiver uses the sigmoidoscope to look at the last third of your colon. °· Colonoscopy. This test is like sigmoidoscopy, but the caregiver looks at the entire colon. This is the most common method for finding and removing polyps. °TREATMENT  °Any polyps will be removed during a sigmoidoscopy or colonoscopy. The polyps are then tested for cancer. °PREVENTION  °To help lower your risk of getting more colon polyps: °· Eat plenty of fruits and vegetables. Avoid eating fatty foods. °· Do not smoke. °· Avoid drinking alcohol. °· Exercise every day. °· Lose weight if recommended by your caregiver. °· Eat plenty of calcium and folate. Foods that are rich in calcium include milk, cheese, and broccoli. Foods that are rich in folate include chickpeas, kidney beans, and spinach. °HOME CARE INSTRUCTIONS °Keep all follow-up appointments as directed by your caregiver. You may need periodic exams to check for polyps. °SEEK MEDICAL CARE IF: °You notice bleeding during a bowel movement. °  °This information is not intended to replace advice given to you by your health care provider. Make sure you discuss any questions you have with your health care provider. °  °Document Released: 06/22/2004 Document Revised: 10/17/2014 Document Reviewed: 12/06/2011 °Elsevier Interactive Patient Education ©2016 Elsevier Inc. ° °

## 2015-10-13 NOTE — Op Note (Signed)
[facility] [address] [place], [address]   COLONOSCOPY PROCEDURE REPORT  PATIENT: Amy Morales, Amy Morales  MR#: [id number] BIRTHDATE: 1945-11-22 , 69  yrs. old GENDER: female ENDOSCOPIST: R.  Garfield Cornea, MD FACP [facility] REFERRED BY:Dr. Marjory Lies PROCEDURE DATE:  10/27/15 PROCEDURE:   Colonoscopy with snare polypectomy INDICATIONS:surveillance examination; history of colonic adenoma. MEDICATIONS: Versed 6 mg IV and Demerol 100 mg IV in divided doses. Zofran 4 mg IV. ASA CLASS:       Class II  CONSENT: The risks, benefits, alternatives and imponderables including but not limited to bleeding, perforation as well as the possibility of a missed lesion have been reviewed.  The potential for biopsy, lesion removal, etc. have also been discussed. Questions have been answered.  All parties agreeable.  Please see the history and physical in the medical record for more information.  DESCRIPTION OF PROCEDURE:   After the risks benefits and alternatives of the procedure were thoroughly explained, informed consent was obtained.  The digital rectal exam revealed no abnormalities of the rectum.   The [id number]  endoscope was introduced through the anus and advanced to the   . No adverse events experienced.   The quality of the prep was adequate  The instrument was then slowly withdrawn as the colon was fully examined. Estimated blood loss is zero unless otherwise noted in this procedure report.      COLON FINDINGS: Normal-appearing rectal mucosa; (1) 5 millimeter polyp in the mid sigmoid segment; (2) nonbleeding AVMs?"one in the descending segment and one in the cecum; otherwise, the remainder of the colonic mucosa appeared normal.  Retroflexion was performed. .  Withdrawal time=8 minutes 0 seconds.  The scope was withdrawn and the procedure completed. COMPLICATIONS: There were no immediate complications. EBL 2 mL ENDOSCOPIC IMPRESSION: Single colonic  polyp?"removed as described above.  Nonbleeding colonic AVMs  RECOMMENDATIONS: Follow up on pathology.  eSigned:  R. Garfield Cornea, MD Rosalita Chessman [facility] October 27, 2015 12:53 PM   cc:  CPT CODES: ICD CODES:  The ICD and CPT codes recommended by this software are interpretations from the data that the clinical staff has captured with the software.  The verification of the translation of this report to the ICD and CPT codes and modifiers is the sole responsibility of the health care institution and practicing physician where this report was generated.  [employer] will not be held responsible for the validity of the ICD and CPT codes included on this report.  AMA assumes no liability for data contained or not contained herein. CPT is a Designer, television/film set of the [employer].  PATIENT NAME:  Amy Morales, Amy Morales MR#: [id number]

## 2015-10-15 ENCOUNTER — Encounter: Payer: Self-pay | Admitting: Internal Medicine

## 2015-10-15 ENCOUNTER — Encounter (HOSPITAL_COMMUNITY): Payer: Self-pay | Admitting: Internal Medicine

## 2019-11-18 ENCOUNTER — Encounter: Payer: Self-pay | Admitting: Internal Medicine

## 2019-11-30 ENCOUNTER — Encounter: Payer: Self-pay | Admitting: Gastroenterology

## 2019-11-30 NOTE — Progress Notes (Signed)
Referring Provider: [name], MD Primary Care Physician:  [name], MD Primary Gastroenterologist:  Dr. Gala Romney  Chief Complaint  Patient presents with  . Dysphagia    HPI:   Amy Morales is a 74 y.o. female presenting today at the request of [name], MD for dysphagia.  GI history significant for constipation, adenomatous colon polyps, and dysphagia. Last EGD in 2006 with noncritical Schatzki ring with distal esophageal erosions without Barrett's s/p dilation, small hiatal hernia, otherwise normal.  Last colonoscopy in January 2017 with 5 mm tubular adenoma, nonbleeding AVMs, otherwise normal.  Recommendations to repeat colonoscopy in 5 years (2022). Last seen by our staff at the time of her colonoscopy.   Today: Has had dysphagia for years. Esophagus dilated in 2006. Symptoms improved for a while after dilation. Has been having return of symptoms for several years now. Reports swallow study in [place] last year that showed a hiatal hernia. Symptoms have been worsening. Occurring more frequently- a couple times a month. Foods are getting stuck around the sternal notch. Typically with meats. No trouble with soft foods or liquids. Doesn't have to bring food up. Will go down in about 5 minutes. States if she drinks something to try to push it down, symptoms worsen. Also has trouble with pills.   Occasional heartburn/GERD symptoms but not even once a month. No abdominal pain. No NSAIDs. No blood in the stool or melena.   Admits to mild constipation. States she has never had BMs regularly. Currently with a BM 2-3 times a week. Typically soft and formed. Rare hard stools or straining. Actually feeling constipation is going some better at this time.   Past Medical History:  Diagnosis Date  . GERD (gastroesophageal reflux disease)   . Hydrocephalus ([place])    went stent in 1969  . Hyperlipemia   . Tubulovillous adenoma polyp of colon 2000    Past Surgical History:    Procedure Laterality Date  . ABDOMINAL HYSTERECTOMY    . COLONOSCOPY  12/09/04   [id number] anal canal hemorrhoids otherwise normal rectum. quiescent 49mm AVM, mid-descending colon, not manipulated.   . COLONOSCOPY  2011   RMR: adenomatous colon polyp  . COLONOSCOPY [name] 10/13/2015   Procedure: COLONOSCOPY;  Surgeon: Daneil Dolin, MD; 5 mm tubular adenoma, nonbleeding AVMs, otherwise normal.  Repeat in 5 years (2022).  . ESOPHAGOGASTRODUODENOSCOPY  12/09/2004   RMR:non-critical appearing schatzkis ring s/p dilation/small HH otherwise normal  . 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 B Complex-C-Folic Acid (KP B COMPLEX-C) TABS Take 1 tablet by mouth daily.     . cholecalciferol (VITAMIN D3) 25 MCG (1000 UNIT) tablet Take 2,000 Units by mouth daily.    . fexofenadine (ALLEGRA) 180 MG tablet Take 180 mg by mouth daily.    Marland Kitchen imipramine (TOFRANIL) 50 MG tablet Take 25 mg by mouth at bedtime.     Marland Kitchen omeprazole (PRILOSEC) 20 MG capsule Take 20 mg by mouth daily.    . simvastatin (ZOCOR) 40 MG tablet Take 40 mg by mouth daily at 6 PM.      No current facility-administered medications for this visit.    Allergies as of 12/02/2019 - Review Complete 12/02/2019  Allergen Reaction Noted  . Latex Rash 09/09/2015  . Codeine Nausea And Vomiting 09/09/2015    Family History  Problem Relation Age of Onset  . Colon polyps Son   .  Lung disease Father        black lung  . Heart attack Father   . Colon cancer Neg Hx     Social History   Socioeconomic History  . Marital status: Divorced    Spouse name: Not on file  . Number of children: Not on file  . Years of education: Not on file  . Highest education level: Not on file  Occupational History  . Not on file  Tobacco Use  . Smoking status: Never Smoker  . Smokeless tobacco: Never Used  Substance and Sexual Activity  . Alcohol use: No    Alcohol/week: 0.0  standard drinks  . Drug use: No  . Sexual activity: Not on file  Other Topics Concern  . Not on file  Social History Narrative  . Not on file   Social Determinants of Health   Financial Resource Strain:   . Difficulty of Paying Living Expenses: Not on file  Food Insecurity:   . Worried About Charity fundraiser in the Last Year: Not on file  . Ran Out of Food in the Last Year: Not on file  Transportation Needs:   . Lack of Transportation (Medical): Not on file  . Lack of Transportation (Non-Medical): Not on file  Physical Activity:   . Days of Exercise per Week: Not on file  . Minutes of Exercise per Session: Not on file  Stress:   . Feeling of Stress : Not on file  Social Connections:   . Frequency of Communication with Friends and Family: Not on file  . Frequency of Social Gatherings with Friends and Family: Not on file  . Attends Religious Services: Not on file  . Active Member of Clubs or Organizations: Not on file  . Attends Archivist Meetings: Not on file  . Marital Status: Not on file  Intimate Partner Violence:   . Fear of Current or Ex-Partner: Not on file  . Emotionally Abused: Not on file  . Physically Abused: Not on file  . Sexually Abused: Not on file    Review of Systems: Gen: Denies any fever, chills, pre-syncope or syncope.  CV: Denies chest pain or heart palpitations Resp: Denies shortness of breath or cough. GI: See HPI Derm: Denies rash Psych: Denies depression or anxiety Heme: See HPI  Physical Exam: BP (!) 144/88   Pulse 85   Temp 98 F (36.7 C) (Temporal)   Ht 5\' 3"  (1.6 m)   Wt 145 lb (65.8 kg)   BMI 25.69 kg/m  General:   Alert and oriented. Pleasant and cooperative. Well-nourished and well-developed.  Head:  Normocephalic and atraumatic. Eyes:  Without icterus, sclera clear and conjunctiva pink.  Ears:  Normal auditory acuity. Lungs:  Clear to auscultation bilaterally. No wheezes, rales, or rhonchi. No distress.  Heart:   S1, S2 present without murmurs appreciated.  Abdomen:  +BS, soft, non-tender and non-distended. No HSM noted. No guarding or rebound. No masses appreciated.  Rectal:  Deferred  Msk:  Symmetrical without gross deformities. Normal posture.. Extremities:  Without edema. Neurologic:  Alert and  oriented x4;  grossly normal neurologically. Skin:  Intact without significant lesions or rashes. Psych: Normal mood and affect.

## 2019-12-02 ENCOUNTER — Encounter: Payer: Self-pay | Admitting: Gastroenterology

## 2019-12-02 ENCOUNTER — Ambulatory Visit (INDEPENDENT_AMBULATORY_CARE_PROVIDER_SITE_OTHER): Payer: Medicare Other | Admitting: Gastroenterology

## 2019-12-02 ENCOUNTER — Other Ambulatory Visit: Payer: Self-pay

## 2019-12-02 VITALS — BP 144/88 | HR 85 | Temp 98.0°F | Ht 63.0 in | Wt 145.0 lb

## 2019-12-02 DIAGNOSIS — R1310 Dysphagia, unspecified: Secondary | ICD-10-CM | POA: Insufficient documentation

## 2019-12-02 DIAGNOSIS — K5900 Constipation, unspecified: Secondary | ICD-10-CM

## 2019-12-02 NOTE — Patient Instructions (Signed)
We will get you scheduled for an upper endoscopy with possible dilation of your esophagus in the near future with Dr. Gala Romney.   Should something get stuck in your esophagus and not come up or go down, you should proceed to the emergency room.   For now, be sure all meats are ground or chopped finely.   Be sure you are drinking enough water to keep urine pale yellow to clear.   Add benefiber or metamucil daily. These are fiber supplements that should help with constipation.   You may also use MiraLAX 1 capful (17g) in 8 oz of water daily as needed for constipation. If you have frequent loose stools, decrease the frequency.   We will see you back after your procedure. Call if questions or concerns prior.   Amy Altes, PA-C [facility]

## 2019-12-02 NOTE — Assessment & Plan Note (Addendum)
History of dysphagia with last EGD in 2006 with noncritical Schatzki ring with distal esophageal erosions without Barrett's s/p dilation, small hiatal hernia, otherwise normal.  Symptoms improved for a while after dilation; however, they have now been present for several years and are becoming more frequent.  Typically occurring a couple times a month with meats.  No trouble with soft foods or liquids.  She does have pill dysphagia as well.  GERD symptoms are well controlled.  No other alarm symptoms.  Suspect esophageal web, ring, or structure. Less likely malignancy. She needs EGD for evaluation/therapurtic intervention.   Proceed with EGD +/- dilation with Dr. Gala Romney in the near future. The risks, benefits, and alternatives have been discussed in detail with patient. They have stated understanding and desire to proceed.  Continue omeprazole 20 mg daily.  Advised that all meats should be chopped or ground finely for now.  If something were to get stuck in her esophagus and not come up or go down, she was advised to proceed to the ED.  Follow-up after EGD. Call if questions or concerns prior.

## 2019-12-02 NOTE — Assessment & Plan Note (Addendum)
Mild chronic constipation.  Currently with BMs 2-3 times a week. No alarm symptoms.  History of adenomatous colon polyps.  Colonoscopy up-to-date and due for repeat in 2022.   Add Benefiber or Metamucil daily. Use MiraLAX 1 capful (17 g) in 8 ounces of water daily as needed. Drink enough water to keep urine pale yellow to clear. Follow-up after EGD.

## 2019-12-02 NOTE — Progress Notes (Signed)
Cc'ed to pcp °

## 2019-12-03 ENCOUNTER — Telehealth: Payer: Self-pay | Admitting: Internal Medicine

## 2019-12-03 NOTE — Telephone Encounter (Signed)
Called pt, EGD/-/+DIL w/RMR rescheduled to 01/08/20 at 7:30am. COVID test 01/06/20 at 8:00am. Endo scheduler informed. Appt letter and procedure instructions mailed.

## 2019-12-03 NOTE — Telephone Encounter (Signed)
Pt wants to reschedule her procedure on 3/3 with RMR because she can't find a ride. Please call [phone]

## 2019-12-10 ENCOUNTER — Other Ambulatory Visit (HOSPITAL_COMMUNITY): Payer: Medicare Other

## 2020-01-06 ENCOUNTER — Other Ambulatory Visit: Payer: Self-pay

## 2020-01-06 ENCOUNTER — Other Ambulatory Visit (HOSPITAL_COMMUNITY)
Admission: RE | Admit: 2020-01-06 | Discharge: 2020-01-06 | Disposition: A | Discharge status: Home / Self Care | Payer: Medicare Other | Source: Ambulatory Visit | Attending: Internal Medicine | Admitting: Internal Medicine

## 2020-01-06 DIAGNOSIS — Z01812 Encounter for preprocedural laboratory examination: Secondary | ICD-10-CM | POA: Diagnosis present

## 2020-01-06 DIAGNOSIS — Z20822 Contact with and (suspected) exposure to covid-19: Secondary | ICD-10-CM | POA: Diagnosis not present

## 2020-01-06 LAB — SARS CORONAVIRUS 2 (TAT 6-24 HRS): SARS Coronavirus 2: NEGATIVE

## 2020-01-08 ENCOUNTER — Other Ambulatory Visit: Payer: Self-pay

## 2020-01-08 ENCOUNTER — Ambulatory Visit (HOSPITAL_COMMUNITY)
Admission: RE | Admit: 2020-01-08 | Discharge: 2020-01-08 | Disposition: A | Discharge status: Home / Self Care | Payer: Medicare Other | Attending: Internal Medicine | Admitting: Internal Medicine

## 2020-01-08 ENCOUNTER — Encounter (HOSPITAL_COMMUNITY)
Admission: RE | Disposition: A | Discharge status: Home / Self Care | Payer: Self-pay | Source: Home / Self Care | Attending: Internal Medicine

## 2020-01-08 ENCOUNTER — Encounter (HOSPITAL_COMMUNITY): Payer: Self-pay | Admitting: Internal Medicine

## 2020-01-08 DIAGNOSIS — R1314 Dysphagia, pharyngoesophageal phase: Secondary | ICD-10-CM | POA: Diagnosis not present

## 2020-01-08 DIAGNOSIS — Z885 Allergy status to narcotic agent status: Secondary | ICD-10-CM | POA: Diagnosis not present

## 2020-01-08 DIAGNOSIS — Z79899 Other long term (current) drug therapy: Secondary | ICD-10-CM | POA: Diagnosis not present

## 2020-01-08 DIAGNOSIS — K219 Gastro-esophageal reflux disease without esophagitis: Secondary | ICD-10-CM | POA: Insufficient documentation

## 2020-01-08 DIAGNOSIS — E785 Hyperlipidemia, unspecified: Secondary | ICD-10-CM | POA: Diagnosis not present

## 2020-01-08 DIAGNOSIS — Z86010 Personal history of colonic polyps: Secondary | ICD-10-CM | POA: Diagnosis not present

## 2020-01-08 DIAGNOSIS — R1310 Dysphagia, unspecified: Secondary | ICD-10-CM | POA: Diagnosis present

## 2020-01-08 HISTORY — PX: ESOPHAGOGASTRODUODENOSCOPY: SHX5428

## 2020-01-08 HISTORY — PX: MALONEY DILATION: SHX5535

## 2020-01-08 SURGERY — EGD (ESOPHAGOGASTRODUODENOSCOPY)
Anesthesia: Moderate Sedation

## 2020-01-08 MED ORDER — SODIUM CHLORIDE 0.9 % IV SOLN: INTRAVENOUS | Status: DC

## 2020-01-08 MED ORDER — MEPERIDINE HCL 100 MG/ML IJ SOLN
INTRAMUSCULAR | Status: DC | PRN
  Administered 2020-01-08: 10 mg via INTRAVENOUS
  Administered 2020-01-08: 25 mg via INTRAVENOUS
  Administered 2020-01-08: 15 mg via INTRAVENOUS

## 2020-01-08 MED ORDER — SIMETHICONE 40 MG/0.6ML PO SUSP
ORAL | Status: AC
  Filled 2020-01-08: qty 0.6

## 2020-01-08 MED ORDER — MEPERIDINE HCL 50 MG/ML IJ SOLN
INTRAMUSCULAR | Status: AC
  Filled 2020-01-08: qty 1

## 2020-01-08 MED ORDER — LIDOCAINE VISCOUS HCL 2 % MT SOLN
OROMUCOSAL | Status: AC
  Filled 2020-01-08: qty 15

## 2020-01-08 MED ORDER — ONDANSETRON HCL 4 MG/2ML IJ SOLN
INTRAMUSCULAR | Status: AC
  Filled 2020-01-08: qty 2

## 2020-01-08 MED ORDER — STERILE WATER FOR IRRIGATION IR SOLN
Status: DC | PRN
  Administered 2020-01-08: 2.5 mL

## 2020-01-08 MED ORDER — ONDANSETRON HCL 4 MG/2ML IJ SOLN
INTRAMUSCULAR | Status: DC | PRN
  Administered 2020-01-08: 4 mg via INTRAVENOUS

## 2020-01-08 MED ORDER — MIDAZOLAM HCL 5 MG/5ML IJ SOLN
INTRAMUSCULAR | Status: DC | PRN
  Administered 2020-01-08 (×2): 1 mg via INTRAVENOUS
  Administered 2020-01-08 (×2): 2 mg via INTRAVENOUS
  Administered 2020-01-08: 1 mg via INTRAVENOUS

## 2020-01-08 MED ORDER — MIDAZOLAM HCL 5 MG/5ML IJ SOLN
INTRAMUSCULAR | Status: AC
  Filled 2020-01-08: qty 10

## 2020-01-08 NOTE — Op Note (Signed)
[facility] Patient Name: Amy Morales Procedure Date: 01/08/2020 7:13 AM MRN: [id number] Date of Birth: 06-Nov-1945 Attending MD: Norvel Richards , MD CSN: [id number] Age: 74 Admit Type: Outpatient Procedure:                Upper GI endoscopy Indications:              Dysphagia Providers:                Norvel Richards, MD, Lurline Del, RN, Randa Spike, Technician Referring MD:              Medicines:                Midazolam 7 mg IV, Ondansetron 4 mg IV Complications:            No immediate complications. Estimated Blood Loss:     Estimated blood loss: none. Estimated blood loss:                            none. Procedure:                Pre-Anesthesia Assessment:                           - Prior to the procedure, a History and Physical                            was performed, and patient medications and                            allergies were reviewed. The patient's tolerance of                            previous anesthesia was also reviewed. The risks                            and benefits of the procedure and the sedation                            options and risks were discussed with the patient.                            All questions were answered, and informed consent                            was obtained. Prior Anticoagulants: The patient has                            taken no previous anticoagulant or antiplatelet                            agents. ASA Grade Assessment: II - A patient with  mild systemic disease. After reviewing the risks                            and benefits, the patient was deemed in                            satisfactory condition to undergo the procedure.                           After obtaining informed consent, the endoscope was                            passed under direct vision. Throughout the                            procedure, the patient's blood pressure,  pulse, and                            oxygen saturations were monitored continuously. The                            [id number] was introduced through the                            mouth, and advanced to the second part of duodenum.                            The upper GI endoscopy was accomplished without                            difficulty. The patient tolerated the procedure                            well. Scope In: 7:53:16 AM Scope Out: 8:01:00 AM Total Procedure Duration: 0 hours 7 minutes 44 seconds  Findings:      The examined esophagus was normal.      The entire examined stomach was normal.      The duodenal bulb and second portion of the duodenum were normal. The       scope was withdrawn. Dilation was performed with a Maloney dilator with       mild resistance at 16 Fr. The dilation site was examined following       endoscope reinsertion and showed no change. Estimated blood loss was       minimal. Impression:               - Normal esophagus.                           - Normal stomach.                           - Normal duodenal bulb and second portion of the                            duodenum.                           -  No specimens collected. Moderate Sedation:      Moderate (conscious) sedation was administered by the endoscopy nurse       and supervised by the endoscopist. The following parameters were       monitored: oxygen saturation, heart rate, blood pressure, respiratory       rate, EKG, adequacy of pulmonary ventilation, and response to care.       Total physician intraservice time was 22 minutes. Recommendation:           - Patient has a contact number available for                            emergencies. The signs and symptoms of potential                            delayed complications were discussed with the                            patient. Return to normal activities tomorrow.                            Written discharge instructions were  provided to the                            patient.                           - Resume previous diet.                           - Continue present medications.                           - Return to my office in 3 months. Procedure Code(s):        --- Professional ---                           [phone], Esophagogastroduodenoscopy, flexible,                            transoral; diagnostic, including collection of                            specimen(s) by brushing or washing, when performed                            (separate procedure)                           43450, Dilation of esophagus, by unguided sound or                            bougie, single or multiple passes                           G0500, Moderate sedation services provided by the  same physician or other qualified health care                            professional performing a gastrointestinal                            endoscopic service that sedation supports,                            requiring the presence of an independent trained                            observer to assist in the monitoring of the                            patient's level of consciousness and physiological                            status; initial 15 minutes of intra-service time;                            patient age 54 years or older (additional time may                            be reported with [id number], as appropriate) Diagnosis Code(s):        --- Professional ---                           R13.10, Dysphagia, unspecified CPT copyright 2019 American [employer] Association. All rights reserved. The codes documented in this report are preliminary and upon coder review may  be revised to meet current compliance requirements. Cristopher Estimable. Vivan Agostino, MD Norvel Richards, MD 01/08/2020 8:19:24 AM This report has been signed electronically. Number of Addenda: 0

## 2020-01-08 NOTE — H&P (Signed)
@LOGO @   Primary Care Physician:  [name], MD Primary Gastroenterologist:  Dr. Gala Romney  Pre-Procedure History & Physical: HPI:  Amy Morales is a 74 y.o. female here for further evaluation of recurrent, chronic esophageal dysphagia.  History of noncritical Schatzki's ring.  States omeprazole 20 mg daily is controlling her reflux symptoms fairly well but continues to have frequent breakthrough.  Past Medical History:  Diagnosis Date  . GERD (gastroesophageal reflux disease)   . Hydrocephalus ([place])    went stent in 1969  . Hyperlipemia   . Tubulovillous adenoma polyp of colon 2000    Past Surgical History:  Procedure Laterality Date  . ABDOMINAL HYSTERECTOMY    . COLONOSCOPY  12/09/04   [id number] anal canal hemorrhoids otherwise normal rectum. quiescent 11mm AVM, mid-descending colon, not manipulated.   . COLONOSCOPY  2011   RMR: adenomatous colon polyp  . COLONOSCOPY [name] 10/13/2015   Procedure: COLONOSCOPY;  Surgeon: Daneil Dolin, MD; 5 mm tubular adenoma, nonbleeding AVMs, otherwise normal.  Repeat in 5 years (2022).  . ESOPHAGOGASTRODUODENOSCOPY  12/09/2004   RMR:non-critical appearing schatzkis ring s/p dilation/small HH otherwise normal  . intraventricular shunt  1969   revision in 2013    Prior to Admission medications   Medication Sig Start Date End Date Taking? Authorizing Provider  acetaminophen (TYLENOL) 325 MG tablet Take 325-650 mg by mouth every 6 (six) hours as needed (for pain.).   Yes [provider]  B Complex-C-Folic Acid (KP B COMPLEX-C) TABS Take 1 tablet by mouth daily.    Yes [provider]  Cholecalciferol (VITAMIN D3 SUPER STRENGTH) 50 MCG (2000 UT) TABS Take 2,000 Units by mouth daily.   Yes [provider]  diphenhydrAMINE (BENADRYL) 25 MG tablet Take 25 mg by mouth every 6 (six) hours as needed for allergies.   Yes [provider]  fexofenadine (ALLEGRA) 180 MG tablet Take 180 mg by mouth at bedtime.    Yes  [provider]  imipramine (TOFRANIL) 25 MG tablet Take 25 mg by mouth at bedtime. 10/20/19  Yes [provider]  latanoprost (XALATAN) 0.005 % ophthalmic solution Place 1 drop into both eyes at bedtime. 10/09/19  Yes [provider]  Multiple Vitamin (MULTIVITAMIN WITH MINERALS) TABS tablet Take 1 tablet by mouth daily. Centrum Silver   Yes [provider]  omeprazole (PRILOSEC) 20 MG capsule Take 20 mg by mouth daily before breakfast.    Yes [provider]  simvastatin (ZOCOR) 40 MG tablet Take 40 mg by mouth at bedtime.    Yes [provider]    Allergies as of 12/02/2019 - Review Complete 12/02/2019  Allergen Reaction Noted  . Latex Rash 09/09/2015  . Codeine Nausea And Vomiting 09/09/2015    Family History  Problem Relation Age of Onset  . Colon polyps Son   . Lung disease Father        black lung  . Heart attack Father   . Colon cancer Neg Hx     Social History   Socioeconomic History  . Marital status: Divorced    Spouse name: Not on file  . Number of children: Not on file  . Years of education: Not on file  . Highest education level: Not on file  Occupational History  . Not on file  Tobacco Use  . Smoking status: Never Smoker  . Smokeless tobacco: Never Used  Substance and Sexual Activity  . Alcohol use: No    Alcohol/week: 0.0 standard drinks  .  Drug use: No  . Sexual activity: Not on file  Other Topics Concern  . Not on file  Social History Narrative  . Not on file   Social Determinants of Health   Financial Resource Strain:   . Difficulty of Paying Living Expenses:   Food Insecurity:   . Worried About Charity fundraiser in the Last Year:   . Arboriculturist in the Last Year:   Transportation Needs:   . Film/video editor (Medical):   Marland Kitchen Lack of Transportation (Non-Medical):   Physical Activity:   . Days of Exercise per Week:   . Minutes of Exercise per Session:   Stress:   . Feeling of  Stress :   Social Connections:   . Frequency of Communication with Friends and Family:   . Frequency of Social Gatherings with Friends and Family:   . Attends Religious Services:   . Active Member of Clubs or Organizations:   . Attends Archivist Meetings:   Marland Kitchen Marital Status:   Intimate Partner Violence:   . Fear of Current or Ex-Partner:   . Emotionally Abused:   Marland Kitchen Physically Abused:   . Sexually Abused:     Review of Systems: See HPI, otherwise negative ROS  Physical Exam: BP 129/82   Pulse 90   Temp 98.1 F (36.7 C) (Oral)   Resp 12   Ht 5\' 3"  (1.6 m)   Wt 63.5 kg   SpO2 99%   BMI 24.80 kg/m  General:   Alert,  Well-developed, well-nourished, pleasant and cooperative in NAD Mouth:  No deformity or lesions. Neck:  Supple; no masses or thyromegaly. No significant cervical adenopathy. Lungs:  Clear throughout to auscultation.   No wheezes, crackles, or rhonchi. No acute distress. Heart:  Regular rate and rhythm; no murmurs, clicks, rubs,  or gallops. Abdomen: Non-distended, normal bowel sounds.  Soft and nontender without appreciable mass or hepatosplenomegaly.  Pulses:  Normal pulses noted. Extremities:  Without clubbing or edema.  Impression/Plan: 74 year old lady with recurrent esophageal dysphagia.  Longstanding GERD.  Here for EGD with possible esophageal dilation as feasible/appropriate per plan. The risks, benefits, limitations, alternatives and imponderables have been reviewed with the patient. Potential for esophageal dilation, biopsy, etc. have also been reviewed.  Questions have been answered. All parties agreeable.     Notice: This dictation was prepared with Dragon dictation along with smaller phrase technology. Any transcriptional errors that result from this process are unintentional and may not be corrected upon review.

## 2020-01-08 NOTE — Discharge Instructions (Signed)
EGD Discharge instructions Please read the instructions outlined below and refer to this sheet in the next few weeks. These discharge instructions provide you with general information on caring for yourself after you leave the hospital. Your doctor may also give you specific instructions. While your treatment has been planned according to the most current medical practices available, unavoidable complications occasionally occur. If you have any problems or questions after discharge, please call your doctor. ACTIVITY  You may resume your regular activity but move at a slower pace for the next 24 hours.   Take frequent rest periods for the next 24 hours.   Walking will help expel (get rid of) the air and reduce the bloated feeling in your abdomen.   No driving for 24 hours (because of the anesthesia (medicine) used during the test).   You may shower.   Do not sign any important legal documents or operate any machinery for 24 hours (because of the anesthesia used during the test).  NUTRITION  Drink plenty of fluids.   You may resume your normal diet.   Begin with a light meal and progress to your normal diet.   Avoid alcoholic beverages for 24 hours or as instructed by your caregiver.  MEDICATIONS  You may resume your normal medications unless your caregiver tells you otherwise.  WHAT YOU CAN EXPECT TODAY  You may experience abdominal discomfort such as a feeling of fullness or "gas" pains.  FOLLOW-UP  Your doctor will discuss the results of your test with you.  SEEK IMMEDIATE MEDICAL ATTENTION IF ANY OF THE FOLLOWING OCCUR:  Excessive nausea (feeling sick to your stomach) and/or vomiting.   Severe abdominal pain and distention (swelling).   Trouble swallowing.   Temperature over 101 F (37.8 C).   Rectal bleeding or vomiting of blood.   GERD information provided  Increase omeprazole to 40 mg daily  Office visit with [place] in 3 months  At patient request, I called Amy Morales at [phone] and reviewed results.   Gastroesophageal Reflux Disease, Adult Gastroesophageal reflux (GER) happens when acid from the stomach flows up into the tube that connects the mouth and the stomach (esophagus). Normally, food travels down the esophagus and stays in the stomach to be digested. With GER, food and stomach acid sometimes move back up into the esophagus. You may have a disease called gastroesophageal reflux disease (GERD) if the reflux:  Happens often.  Causes frequent or very bad symptoms.  Causes problems such as damage to the esophagus. When this happens, the esophagus becomes sore and swollen (inflamed). Over time, GERD can make small holes (ulcers) in the lining of the esophagus. What are the causes? This condition is caused by a problem with the muscle between the esophagus and the stomach. When this muscle is weak or not normal, it does not close properly to keep food and acid from coming back up from the stomach. The muscle can be weak because of:  Tobacco use.  Pregnancy.  Having a certain type of hernia (hiatal hernia).  Alcohol use.  Certain foods and drinks, such as coffee, chocolate, onions, and peppermint. What increases the risk? You are more likely to develop this condition if you:  Are overweight.  Have a disease that affects your connective tissue.  Use NSAID medicines. What are the signs or symptoms? Symptoms of this condition include:  Heartburn.  Difficult or painful swallowing.  The feeling of having a lump in the throat.  A bitter taste in the  mouth.  Bad breath.  Having a lot of saliva.  Having an upset or bloated stomach.  Belching.  Chest pain. Different conditions can cause chest pain. Make sure you see your doctor if you have chest pain.  Shortness of breath or noisy breathing (wheezing).  Ongoing (chronic) cough or a cough at night.  Wearing away of the surface of teeth (tooth enamel).  Weight  loss. How is this treated? Treatment will depend on how bad your symptoms are. Your doctor may suggest:  Changes to your diet.  Medicine.  Surgery. Follow these instructions at home: Eating and drinking   Follow a diet as told by your doctor. You may need to avoid foods and drinks such as: ? Coffee and tea (with or without caffeine). ? Drinks that contain alcohol. ? Energy drinks and sports drinks. ? Bubbly (carbonated) drinks or sodas. ? Chocolate and cocoa. ? Peppermint and mint flavorings. ? Garlic and onions. ? Horseradish. ? Spicy and acidic foods. These include peppers, chili powder, curry powder, vinegar, hot sauces, and BBQ sauce. ? Citrus fruit juices and citrus fruits, such as oranges, lemons, and limes. ? Tomato-based foods. These include red sauce, chili, salsa, and pizza with red sauce. ? Fried and fatty foods. These include donuts, french fries, potato chips, and high-fat dressings. ? High-fat meats. These include hot dogs, rib eye steak, sausage, ham, and bacon. ? High-fat dairy items, such as whole milk, butter, and cream cheese.  Eat small meals often. Avoid eating large meals.  Avoid drinking large amounts of liquid with your meals.  Avoid eating meals during the 2-3 hours before bedtime.  Avoid lying down right after you eat.  Do not exercise right after you eat. Lifestyle   Do not use any products that contain nicotine or tobacco. These include cigarettes, e-cigarettes, and chewing tobacco. If you need help quitting, ask your doctor.  Try to lower your stress. If you need help doing this, ask your doctor.  If you are overweight, lose an amount of weight that is healthy for you. Ask your doctor about a safe weight loss goal. General instructions  Pay attention to any changes in your symptoms.  Take over-the-counter and prescription medicines only as told by your doctor. Do not take aspirin, ibuprofen, or other NSAIDs unless your doctor says it is  okay.  Wear loose clothes. Do not wear anything tight around your waist.  Raise (elevate) the head of your bed about 6 inches (15 cm).  Avoid bending over if this makes your symptoms worse.  Keep all follow-up visits as told by your doctor. This is important. Contact a doctor if:  You have new symptoms.  You lose weight and you do not know why.  You have trouble swallowing or it hurts to swallow.  You have wheezing or a cough that keeps happening.  Your symptoms do not get better with treatment.  You have a hoarse voice. Get help right away if:  You have pain in your arms, neck, jaw, teeth, or back.  You feel sweaty, dizzy, or light-headed.  You have chest pain or shortness of breath.  You throw up (vomit) and your throw-up looks like blood or coffee grounds.  You pass out (faint).  Your poop (stool) is bloody or black.  You cannot swallow, drink, or eat. Summary  If a person has gastroesophageal reflux disease (GERD), food and stomach acid move back up into the esophagus and cause symptoms or problems such as damage to the  esophagus.  Treatment will depend on how bad your symptoms are.  Follow a diet as told by your doctor.  Take all medicines only as told by your doctor. This information is not intended to replace advice given to you by your health care provider. Make sure you discuss any questions you have with your health care provider. Document Revised: 04/04/2018 Document Reviewed: 04/04/2018 Elsevier Patient Education  Amy Morales.

## 2020-03-25 ENCOUNTER — Ambulatory Visit: Payer: Medicare Other | Admitting: Gastroenterology

## 2020-06-30 ENCOUNTER — Other Ambulatory Visit: Payer: Self-pay | Admitting: Internal Medicine

## 2020-10-06 ENCOUNTER — Encounter: Payer: Self-pay | Admitting: Internal Medicine

## 2021-02-17 ENCOUNTER — Telehealth: Payer: Self-pay | Admitting: Internal Medicine

## 2021-02-17 NOTE — Telephone Encounter (Signed)
Patient wants to transfer care to [facility] and to forward her records to them. I have mailed her a release of records to sign and mail back to me.

## 2021-02-17 NOTE — Telephone Encounter (Signed)
Mark her dismissed by default.  Her choice.

## 2021-02-17 NOTE — Telephone Encounter (Signed)
Noted  

## 2021-02-19 NOTE — Telephone Encounter (Signed)
I have marked her dismissed from the practice in epic.

## 2021-03-24 ENCOUNTER — Telehealth: Payer: Self-pay | Admitting: Internal Medicine

## 2021-03-24 DIAGNOSIS — Z86010 Personal history of colonic polyps: Secondary | ICD-10-CM

## 2021-03-24 NOTE — Telephone Encounter (Signed)
Hey Dr. Carlean Purl,   We received a referral in our office for patient that needs colonoscopy. Was a recent patient at [facility]. Patient would like to transfer care to you as her son is already a patient. Her records are in [facility] as well as paper. Can you review and advise on scheduling?  Thank you

## 2021-03-26 NOTE — Telephone Encounter (Signed)
She may be scheduled for a direct colonoscopy due to history of colon polyps

## 2021-03-29 ENCOUNTER — Encounter: Payer: Self-pay | Admitting: Internal Medicine

## 2021-03-29 NOTE — Telephone Encounter (Signed)
Thank you Dr. Carlean Purl,   Patient stated she is starting to experience trouble swallowing so I scheduled her OV for 06/01/21.

## 2021-06-01 ENCOUNTER — Encounter: Payer: Self-pay | Admitting: Internal Medicine

## 2021-06-01 ENCOUNTER — Ambulatory Visit (INDEPENDENT_AMBULATORY_CARE_PROVIDER_SITE_OTHER): Payer: Medicare Other | Admitting: Internal Medicine

## 2021-06-01 VITALS — BP 110/68 | HR 88 | Ht 63.0 in | Wt 142.0 lb

## 2021-06-01 DIAGNOSIS — Z86010 Personal history of colonic polyps: Secondary | ICD-10-CM | POA: Diagnosis not present

## 2021-06-01 DIAGNOSIS — R1310 Dysphagia, unspecified: Secondary | ICD-10-CM | POA: Diagnosis not present

## 2021-06-01 NOTE — Progress Notes (Signed)
Amy Morales 75 y.o. Feb 14, 1946 [id number]  Assessment & Plan:   Encounter Diagnoses  Name Primary?   Hx of adenomatous colonic polyps Yes   Dysphagia, unspecified type     She has had 2 diminutive polyps, she is now 75 years old and the newer guidelines would indicate repeating a colonoscopy 7 to 10 years from last.  I offered a repeat colonoscopy but she was comfortable with following up as needed.  I think this is reasonable approach.  We certainly would do a diagnostic procedure if necessary.  Dysphagia is 2 or 3 times a year on and off that is an esophageal spasm.  It may be.  We know she does not have a stricture or cancer based upon last year's EGD so she will monitor and return as needed.  I appreciate the opportunity to care for this patient. CC: Amy Art, MD   Subjective:   Chief Complaint: History of colon polyps time for colonoscopy  HPI This 75 year old white woman presents for transfer of care and consideration for repeat colonoscopy.  Her son is my patient and she requested to come see me.  She had received a recall from her previous practice.  She was seen there last year and had an EGD after a visit.  She has chronic mild constipation that does not disturb her quality of life having 2 or 3 bowel movements a week.  She said that is the way I am.  She also has episodes of dysphagia to solid foods 2 maybe 3 times a year.  Sometimes she will have regurgitation.  She says this does not disturb her quality of life significantly.  EGD March 2021 by Dr. Gala Romney, normal 89 Amy Morales dilator passed Colonoscopy October 13, 2015 5 mm polyp, tubular adenoma and nonbleeding AVMs Colonoscopy November 2011 tubular adenoma diminutive At some point prior to that an adenomatous or perhaps  villous polyp.  Barium swallow 2006 mild stricture at the GE junction with small hiatal hernia and gastroesophageal reflux has having dysphagia then she had a Schatzki ring in 2016  EGD patent, with erosions and a small hiatal hernia  Wt Readings from Last 3 Encounters:  06/01/21 142 lb (64.4 kg)  01/08/20 140 lb (63.5 kg)  12/02/19 145 lb (65.8 kg)    Allergies  Allergen Reactions   Latex Rash   Detrol [Tolterodine]    Codeine Nausea And Vomiting   Current Meds  Medication Sig   acetaminophen (TYLENOL) 325 MG tablet Take 325-650 mg by mouth every 6 (six) hours as needed (for pain.).   B Complex-C-Folic Acid (KP B COMPLEX-C) TABS Take 1 tablet by mouth daily.    Cholecalciferol (VITAMIN D3 SUPER STRENGTH) 50 MCG (2000 UT) TABS Take 2,000 Units by mouth daily.   diphenhydrAMINE (BENADRYL) 25 MG tablet Take 25 mg by mouth every 6 (six) hours as needed for allergies.   fexofenadine (ALLEGRA) 180 MG tablet Take 180 mg by mouth at bedtime.    imipramine (TOFRANIL) 25 MG tablet Take 25 mg by mouth at bedtime.   latanoprost (XALATAN) 0.005 % ophthalmic solution Place 1 drop into both eyes at bedtime.   Multiple Vitamin (MULTIVITAMIN WITH MINERALS) TABS tablet Take 1 tablet by mouth daily. Centrum Silver   omeprazole (PRILOSEC) 40 MG capsule TAKE ONCE A DAY   simvastatin (ZOCOR) 40 MG tablet Take 40 mg by mouth at bedtime.    Past Medical History:  Diagnosis Date   Atrophic vaginitis  Carpal tunnel syndrome    Cobalamin deficiency    Degenerative joint disease    multiple joints   External hemorrhoids    Female stress incontinence    GERD (gastroesophageal reflux disease)    Hx of adenomatous colonic polyps    Hydrocephalus ([facility])    went stent in 1969   Hyperlipemia    Nephrolithiasis    Neurogenic bladder    Osteoporosis    Overactive bladder    Sebaceous hyperplasia    Senile hyperkeratosis    Skin cancer    Tinnitus, bilateral    Trigger finger    Vitamin D deficiency    Past Surgical History:  Procedure Laterality Date   ABDOMINAL HYSTERECTOMY  1983   Bladder tack  2008   CARPAL TUNNEL RELEASE  2012   COLONOSCOPY  12/09/2004   [id number]  anal canal hemorrhoids otherwise normal rectum. quiescent 68m AVM, mid-descending colon, not manipulated.    COLONOSCOPY  2011   RMR: adenomatous colon polyp   COLONOSCOPY [name] 10/13/2015   Procedure: COLONOSCOPY;  Surgeon: RDaneil Dolin MD; 5 mm tubular adenoma, nonbleeding AVMs, otherwise normal.  Repeat in 5 years (2022).   ESOPHAGOGASTRODUODENOSCOPY  12/09/2004   RMR:non-critical appearing schatzkis ring s/p dilation/small HH otherwise normal   ESOPHAGOGASTRODUODENOSCOPY [name] 01/08/2020   Procedure: ESOPHAGOGASTRODUODENOSCOPY (EGD);  Surgeon: RDaneil Dolin MD;  [facility];  Service: Endoscopy;  Laterality: [name];  2:00pm   EYE SURGERY     intraventricular shunt  1969   revision in 2013   MA[place]utneyN/A 01/08/2020   Procedure: MALONEY DILATION;  Surgeon: RDaneil Dolin MD;  Location: [facility];  Service: Endoscopy;  Laterality: [name];   opened shunt in head  12/09/2011   ORIF left hip  03/2018   TONSILLECTOMY     Social History   Social History Narrative   The patient is divorced has 3 sons   She is retired   No alcohol 1 caffeinated beverage a day never smoker no tobacco or drug use   family history includes COPD in her father; Colon polyps in her son; Heart attack in her father; Hypertension in her mother; Lung disease in her father; Skin cancer in her mother.   Review of Systems As per HPI Some urinary incontinence reported.  All other review of systems negative. Objective:   Physical Exam BP 110/68   Pulse 88   Ht '5\' 3"'$  (1.6 m)   Wt 142 lb (64.4 kg)   BMI 25.15 kg/m  Petite pleasant elderly white woman no acute distress Neck is supple without thyromegaly or mass She is alert and oriented x3

## 2021-06-01 NOTE — Patient Instructions (Signed)
If you are age 76 or older, your body mass index should be between 23-30. Your Body mass index is 25.15 kg/m. If this is out of the aforementioned range listed, please consider follow up with your Primary Care Provider.  If you are age 53 or younger, your body mass index should be between 19-25. Your Body mass index is 25.15 kg/m. If this is out of the aformentioned range listed, please consider follow up with your Primary Care Provider.   __________________________________________________________  The [facility] providers would like to encourage you to use [facility] to communicate with providers for non-urgent requests or questions.  Due to long hold times on the telephone, sending your provider a message by [facility] may be a faster and more efficient way to get a response.  Please allow 48 business hours for a response.  Please remember that this is for non-urgent requests.   I appreciate the opportunity to care for you. Silvano Rusk, MD, [facility]

## 2021-06-13 ENCOUNTER — Other Ambulatory Visit: Payer: Self-pay | Admitting: Gastroenterology
